# Patient Record
Sex: Male | Born: 1995 | Race: White | Hispanic: No | Marital: Single | Smoking: Never smoker
Health system: Southern US, Community
[De-identification: ages and names within clinical notes are randomized; demographics above are authoritative.]

---

## 2014-06-08 ENCOUNTER — Emergency Department (HOSPITAL_COMMUNITY)
Admission: EM | Admit: 2014-06-08 | Discharge: 2014-06-09 | Disposition: A | Discharge status: Home / Self Care | Attending: Emergency Medicine | Admitting: Emergency Medicine

## 2014-06-08 ENCOUNTER — Encounter (HOSPITAL_COMMUNITY): Payer: Self-pay | Admitting: Emergency Medicine

## 2014-06-08 ENCOUNTER — Emergency Department (HOSPITAL_COMMUNITY)

## 2014-06-08 DIAGNOSIS — Y9289 Other specified places as the place of occurrence of the external cause: Secondary | ICD-10-CM | POA: Diagnosis not present

## 2014-06-08 DIAGNOSIS — S060X0A Concussion without loss of consciousness, initial encounter: Secondary | ICD-10-CM | POA: Diagnosis not present

## 2014-06-08 DIAGNOSIS — Y9351 Activity, roller skating (inline) and skateboarding: Secondary | ICD-10-CM | POA: Insufficient documentation

## 2014-06-08 DIAGNOSIS — R000 Tachycardia, unspecified: Secondary | ICD-10-CM | POA: Diagnosis not present

## 2014-06-08 DIAGNOSIS — W01198A Fall on same level from slipping, tripping and stumbling with subsequent striking against other object, initial encounter: Secondary | ICD-10-CM | POA: Diagnosis not present

## 2014-06-08 DIAGNOSIS — S50312A Abrasion of left elbow, initial encounter: Secondary | ICD-10-CM | POA: Insufficient documentation

## 2014-06-08 DIAGNOSIS — S80212A Abrasion, left knee, initial encounter: Secondary | ICD-10-CM | POA: Diagnosis not present

## 2014-06-08 DIAGNOSIS — S80211A Abrasion, right knee, initial encounter: Secondary | ICD-10-CM | POA: Diagnosis not present

## 2014-06-08 DIAGNOSIS — S0990XA Unspecified injury of head, initial encounter: Secondary | ICD-10-CM | POA: Diagnosis present

## 2014-06-08 DIAGNOSIS — Z23 Encounter for immunization: Secondary | ICD-10-CM | POA: Diagnosis not present

## 2014-06-08 DIAGNOSIS — S199XXA Unspecified injury of neck, initial encounter: Secondary | ICD-10-CM | POA: Diagnosis not present

## 2014-06-08 DIAGNOSIS — S00211A Abrasion of right eyelid and periocular area, initial encounter: Secondary | ICD-10-CM | POA: Insufficient documentation

## 2014-06-08 DIAGNOSIS — T07XXXA Unspecified multiple injuries, initial encounter: Secondary | ICD-10-CM

## 2014-06-08 DIAGNOSIS — S50812A Abrasion of left forearm, initial encounter: Secondary | ICD-10-CM | POA: Diagnosis not present

## 2014-06-08 MED ORDER — SODIUM CHLORIDE 0.9 % IV BOLUS (SEPSIS)
1000.0000 mL | Freq: Once | INTRAVENOUS | Status: AC
  Administered 2014-06-08: 1000 mL via INTRAVENOUS

## 2014-06-08 MED ORDER — ONDANSETRON HCL 4 MG/2ML IJ SOLN
4.0000 mg | Freq: Once | INTRAMUSCULAR | Status: AC
  Administered 2014-06-08: 4 mg via INTRAVENOUS
  Filled 2014-06-08: qty 2

## 2014-06-08 NOTE — ED Notes (Signed)
Pt states he was traveling downhill on skateboard fell hit head on pavement, small abrasion to R eyebrow, bilat knee abrasions. Pt denies LOC. Pt is diaphoretic, pale and states he feels like he is going to pass out.

## 2014-06-08 NOTE — ED Notes (Signed)
Pt. C-collar applied.

## 2014-06-08 NOTE — ED Provider Notes (Signed)
CSN: [id number]     Arrival date & time 06/08/14  2313 History   First MD Initiated Contact with Patient 06/08/14 2328     Chief Complaint  Patient presents with  . Head Injury     (Consider location/radiation/quality/duration/timing/severity/associated sxs/prior Treatment) Patient is a 18 y.o. male presenting with head injury. The history is provided by the patient and medical records.  Head Injury Associated symptoms: headache and neck pain    This is a 18 year old male with no significant past medical history presenting to the ED for head injury.  Patient states he was riding a skateboard without a helmet, was traveling downhill and fell off skateboard. He states he hit the back of his head against the pavement, but did not lose consciousness. Patient has multiple abrasions-- above right eyebrow, left forearm and elbow, and bilateral knees.  Patient states initially after injury he had some dizziness and visual disturbance, however this has resolved and states is now back to baseline. He does note some nausea but denies vomiting. No prior history of significant head injuries. Patient is not currently on any anticoagulants.  Date of last tetanus unknown.  History reviewed. No pertinent past medical history. History reviewed. No pertinent past surgical history. No family history on file. History  Substance Use Topics  . Smoking status: Never Smoker   . Smokeless tobacco: Not on file  . Alcohol Use: No    Review of Systems  Musculoskeletal: Positive for neck pain.  Neurological: Positive for headaches.  All other systems reviewed and are negative.     Allergies  Review of patient's allergies indicates no known allergies.  Home Medications   Prior to Admission medications   Not on File   BP 130/78  Pulse 119  Temp(Src) 98.2 F (36.8 C) (Oral)  Resp 18  SpO2 95%  Physical Exam  Nursing note and vitals reviewed. Constitutional: He is oriented to person, place, and  time. He appears well-developed and well-nourished. No distress.  HENT:  Head: Normocephalic and atraumatic.  Mouth/Throat: Oropharynx is clear and moist.  Abrasion above right eyebrow; no active bleeding; no orbital rim tenderness or deformity; mid-face stable; dentition intact; airway clear  Eyes: Conjunctivae, EOM and lids are normal. Pupils are equal, round, and reactive to light.  Normal confrontation; no visual field cuts; PERRLA  Neck: Neck supple.  c-spine immobilized in collar  Cardiovascular: Normal rate, regular rhythm and normal heart sounds.   Pulmonary/Chest: Effort normal and breath sounds normal. No respiratory distress. He has no wheezes.  Abdominal: Soft. Bowel sounds are normal. There is no tenderness. There is no guarding.  Musculoskeletal: Normal range of motion.  Multiple abrasions to bilateral knees, left forearm and elbow; no active bleeding or retained foreign body; full range of motion of all affected joints without difficulty; no bony deformities noted; normal strength and sensation of all 4 extremities; extremity pulses intact x4  Neurological: He is alert and oriented to person, place, and time. He has normal strength. He displays no tremor. No cranial nerve deficit or sensory deficit. He displays no seizure activity.  AAOx3, answering questions appropriately; equal strength UE and LE bilaterally; CN grossly intact; moves all extremities appropriately without ataxia; no focal neuro deficits or facial asymmetry appreciated  Skin: Skin is warm. He is diaphoretic.  Psychiatric: He has a normal mood and affect.    ED Course  Procedures (including critical care time) Labs Review Labs Reviewed - No data to display  Imaging Review Ct Head Wo  Contrast  06/09/2014   CLINICAL DATA:  Was traveling downhill on skateboard and fell, hitting head on pavement. Small abrasion at the right eyebrow. No loss of consciousness. Diaphoresis and near-syncope. Concern for head or  cervical spine injury. Initial encounter.  EXAM: CT HEAD WITHOUT CONTRAST  CT CERVICAL SPINE WITHOUT CONTRAST  TECHNIQUE: Multidetector CT imaging of the head and cervical spine was performed following the standard protocol without intravenous contrast. Multiplanar CT image reconstructions of the cervical spine were also generated.  COMPARISON:  None.  FINDINGS: CT HEAD FINDINGS  There is no evidence of acute infarction, mass lesion, or intra- or extra-axial hemorrhage on CT.  The posterior fossa, including the cerebellum, brainstem and fourth ventricle, is within normal limits. The third and lateral ventricles, and basal ganglia are unremarkable in appearance. The cerebral hemispheres are symmetric in appearance, with normal gray-white differentiation. No mass effect or midline shift is seen.  There is no evidence of fracture; visualized osseous structures are unremarkable in appearance. The orbits are within normal limits. The paranasal sinuses and mastoid air cells are well-aerated. No significant soft tissue abnormalities are seen.  CT CERVICAL SPINE FINDINGS  There is no evidence of fracture or subluxation. Vertebral bodies demonstrate normal height and alignment. Intervertebral disc spaces are preserved. Prevertebral soft tissues are within normal limits. The visualized neural foramina are grossly unremarkable.  The thyroid gland is unremarkable in appearance. No significant soft tissue abnormalities are seen.  IMPRESSION: 1. No evidence of traumatic intracranial injury or fracture. 2. No evidence of fracture or subluxation along the cervical spine.   Electronically Signed   By: Roanna RaiderJeffery  Chang M.D.   On: 06/09/2014 00:39   Ct Cervical Spine Wo Contrast  06/09/2014   CLINICAL DATA:  Was traveling downhill on skateboard and fell, hitting head on pavement. Small abrasion at the right eyebrow. No loss of consciousness. Diaphoresis and near-syncope. Concern for head or cervical spine injury. Initial encounter.   EXAM: CT HEAD WITHOUT CONTRAST  CT CERVICAL SPINE WITHOUT CONTRAST  TECHNIQUE: Multidetector CT imaging of the head and cervical spine was performed following the standard protocol without intravenous contrast. Multiplanar CT image reconstructions of the cervical spine were also generated.  COMPARISON:  None.  FINDINGS: CT HEAD FINDINGS  There is no evidence of acute infarction, mass lesion, or intra- or extra-axial hemorrhage on CT.  The posterior fossa, including the cerebellum, brainstem and fourth ventricle, is within normal limits. The third and lateral ventricles, and basal ganglia are unremarkable in appearance. The cerebral hemispheres are symmetric in appearance, with normal gray-white differentiation. No mass effect or midline shift is seen.  There is no evidence of fracture; visualized osseous structures are unremarkable in appearance. The orbits are within normal limits. The paranasal sinuses and mastoid air cells are well-aerated. No significant soft tissue abnormalities are seen.  CT CERVICAL SPINE FINDINGS  There is no evidence of fracture or subluxation. Vertebral bodies demonstrate normal height and alignment. Intervertebral disc spaces are preserved. Prevertebral soft tissues are within normal limits. The visualized neural foramina are grossly unremarkable.  The thyroid gland is unremarkable in appearance. No significant soft tissue abnormalities are seen.  IMPRESSION: 1. No evidence of traumatic intracranial injury or fracture. 2. No evidence of fracture or subluxation along the cervical spine.   Electronically Signed   By: Roanna RaiderJeffery  Chang M.D.   On: 06/09/2014 00:39     EKG Interpretation None      MDM   Final diagnoses:  Head injury, initial encounter  Concussion, without loss of consciousness, initial encounter  Multiple abrasions   18 year old male with head injury while skateboarding. No loss of consciousness. On arrival, patient is diaphoretic and tachycardiac. He notes some  nausea.  On exam, patient has no focal neurologic deficit. He does have multiple abrasions scattered across his extremities and above his right eyebrow. C-spine has been immobilized in [place].  Patient given fluids and Zofran with improvement of his symptoms and vital signs. CT head and cervical spine negative for acute findings.  c-collar was removed and patient able to fully range neck without difficulty.  Neurologic exam remains non-focal.  Tetanus updated.  Patient will be discharged home with concussion precautions.  Discussed emergent reasons to return to ED including severe headache, dizziness, confusion, visual disturbance, etc.  Patient's roommate will be with him for the next 24-48 hours.  Discussed plan with patient, he/she acknowledged understanding and agreed with plan of care.  Return precautions given for new or worsening symptoms.  Garlon HatchetLisa M Filemon Breton, PA-C 06/09/14 [phone]

## 2014-06-09 MED ORDER — TETANUS-DIPHTH-ACELL PERTUSSIS 5-2.5-18.5 LF-MCG/0.5 IM SUSP
0.5000 mL | Freq: Once | INTRAMUSCULAR | Status: AC
  Administered 2014-06-09: 0.5 mL via INTRAMUSCULAR
  Filled 2014-06-09: qty 0.5

## 2014-06-09 NOTE — ED Provider Notes (Signed)
Medical screening examination/treatment/procedure(s) were performed by non-physician practitioner and as supervising physician I was immediately available for consultation/collaboration.   EKG Interpretation None       Carlton Sweaney K Jakerra Floyd-Rasch, MD 06/09/14 [phone]

## 2014-06-09 NOTE — Discharge Instructions (Signed)
Take the prescribed medication as directed for nausea.  May take tylenol or motrin as needed for headache. Read attached documents regarding concussion, monitor symptoms closely. Return to the ED for new or worsening symptoms-- dizziness, visual disturbance, severe headache, confusion, etc.

## 2015-11-05 IMAGING — CT CT CERVICAL SPINE W/O CM
2 of 4 series · 6 of 14 positions shown, 7 images · non-contrast
Comparison: None.

CLINICAL DATA: Was traveling downhill on skateboard and fell,
hitting head on pavement. Small abrasion at the right eyebrow. No
loss of consciousness. Diaphoresis and near-syncope. Concern for
head or cervical spine injury. Initial encounter.

EXAM:
CT HEAD WITHOUT CONTRAST
CT CERVICAL SPINE WITHOUT CONTRAST
TECHNIQUE: Multidetector CT imaging of the head and cervical spine was
performed following the standard protocol without intravenous
contrast. Multiplanar CT image reconstructions of the cervical spine
were also generated.

[Series 5: c-spine st · axial · 0.26mm/px · z∈[-251,-167]mm · 3 of 85 slices shown]
[im 22/85  bone]
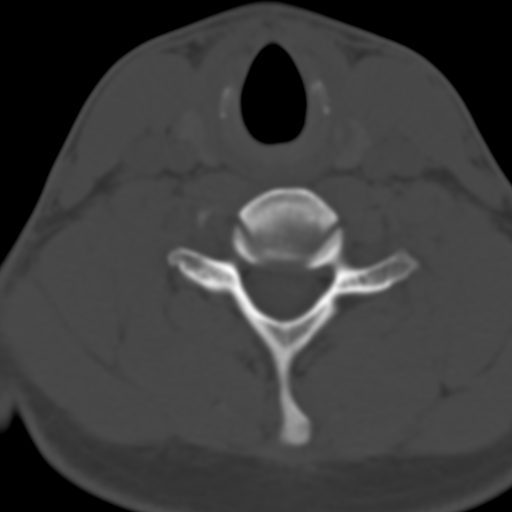
[im 43/85  bone]
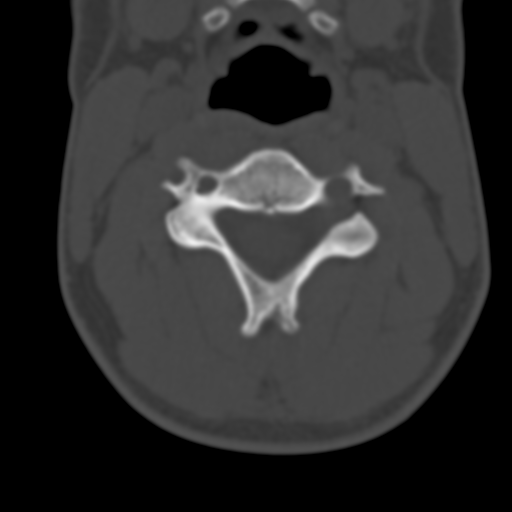
[im 64/85  bone]
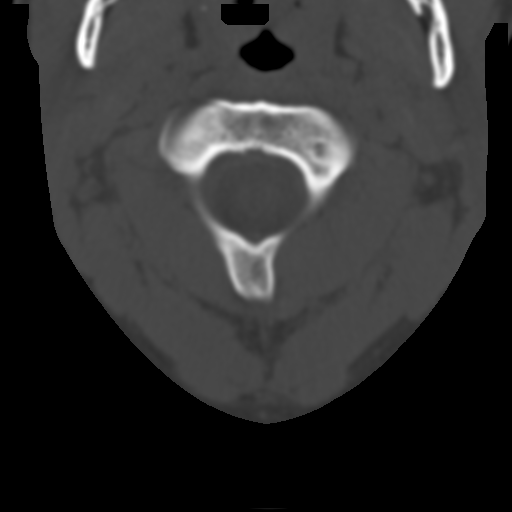

[Series 8: axial recon · axial · 0.23mm/px · z∈[-277,-199]mm · 3 of 90 slices shown, 4 images]
[im 23/90  soft-tissue]
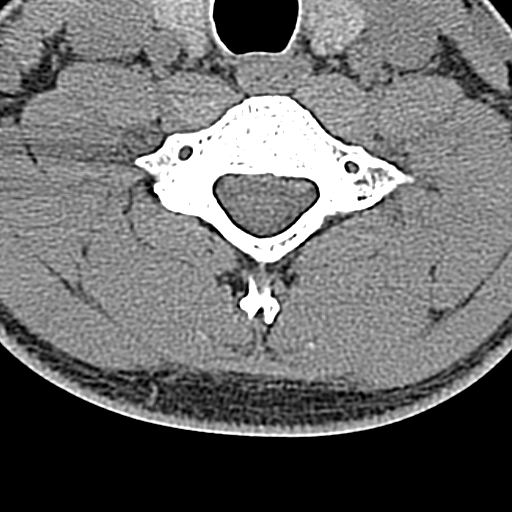
[im 23/90  bone]
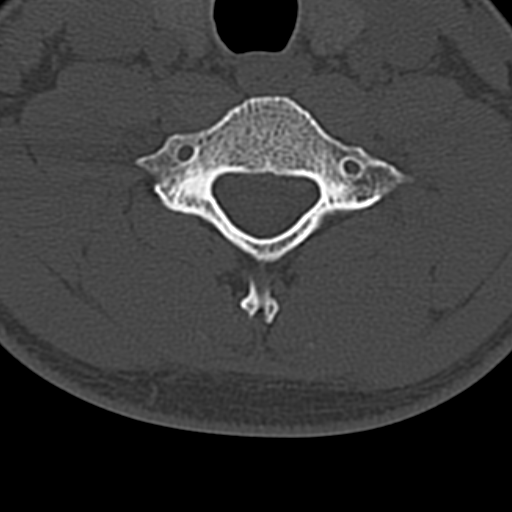
[im 45/90  bone]
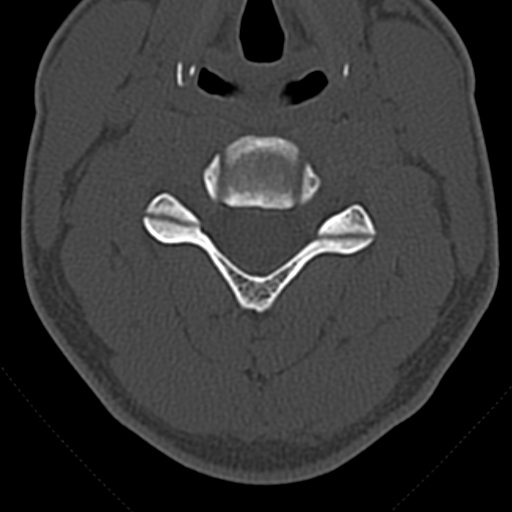
[im 67/90  bone]
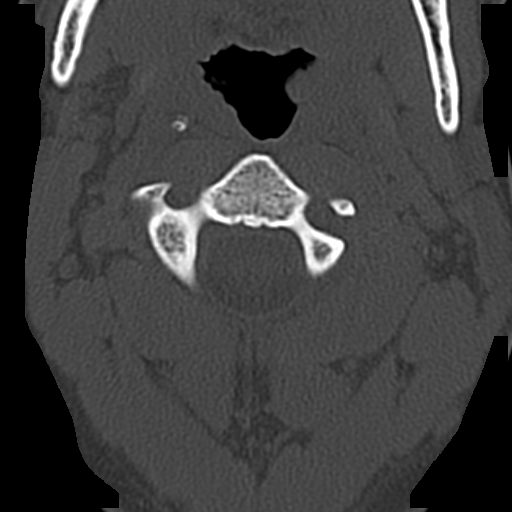

[6 of 14 positions shown; findings below may reference images not displayed]

FINDINGS: CT HEAD FINDINGS

There is no evidence of acute infarction, mass lesion, or intra- or
extra-axial hemorrhage on CT.

The posterior fossa, including the cerebellum, brainstem and fourth
ventricle, is within normal limits. The third and lateral
ventricles, and basal ganglia are unremarkable in appearance. The
cerebral hemispheres are symmetric in appearance, with normal
gray-white differentiation. No mass effect or midline shift is seen.

There is no evidence of fracture; visualized osseous structures are
unremarkable in appearance. The orbits are within normal limits. The
paranasal sinuses and mastoid air cells are well-aerated. No
significant soft tissue abnormalities are seen.

CT CERVICAL SPINE FINDINGS

There is no evidence of fracture or subluxation. Vertebral bodies
demonstrate normal height and alignment. Intervertebral disc spaces
are preserved. Prevertebral soft tissues are within normal limits.
The visualized neural foramina are grossly unremarkable.

The thyroid gland is unremarkable in appearance. No significant soft
tissue abnormalities are seen.
IMPRESSION: 1. No evidence of traumatic intracranial injury or fracture.
2. No evidence of fracture or subluxation along the cervical spine.
